# Patient Record
Sex: Female | Born: 1995 | Race: White | Hispanic: No | Marital: Single | Smoking: Never smoker
Health system: Southern US, Community
[De-identification: ages and names within clinical notes are randomized; demographics above are authoritative.]

## PROBLEM LIST (undated history)

## (undated) HISTORY — PX: WISDOM TOOTH EXTRACTION: SHX21

## (undated) HISTORY — PX: CHOLECYSTECTOMY: SHX55

## (undated) HISTORY — PX: EYE SURGERY: SHX253

## (undated) HISTORY — PX: ADENOIDECTOMY: SUR15

## (undated) HISTORY — PX: TONSILLECTOMY: SUR1361

---
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
## 2015-01-24 DIAGNOSIS — R112 Nausea with vomiting, unspecified: Secondary | ICD-10-CM | POA: Insufficient documentation

## 2015-03-03 IMAGING — NM NM HEPATO W/GB/PHARM/[PERSON_NAME]
2 series · 12 of 12 positions shown · non-contrast
Comparison: None

CLINICAL DATA: Right upper quadrant abdominal pain and nausea for
last year

NUCLEAR MEDICINE HEPATOBILIARY IMAGING WITH GALLBLADDER EF:
TECHNIQUE: Sequential images of the abdomen were obtained for 60
minutes following intravenous administration of
radiopharmaceutical.  Patient then ingested 8 ounces of
commercially available Ensure Plus and imaging was continued for 60
minutes.  A time-activity curve was generated from tracer within
the gallbladder following Ensure Plus ingestion, and the
gallbladder ejection fraction was calculated.
Radiopharmaceutical:  5 mCi Tc-LLm mebrofenin

[Series 0: hepato · 3.20mm/px · 6 of 46 frames shown (1 of 2)]
[frame 4/46]
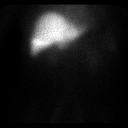
[frame 12/46]
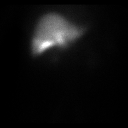
[frame 20/46]
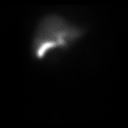
[frame 27/46]
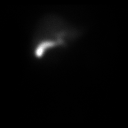
[frame 35/46]
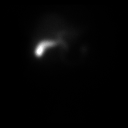
[frame 43/46]
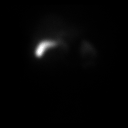

[Series 0: hepato · 3.20mm/px · 6 of 60 frames shown (2 of 2)]
[frame 6/60]
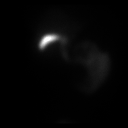
[frame 16/60]
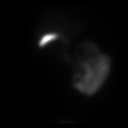
[frame 26/60]
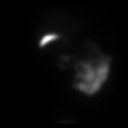
[frame 36/60]
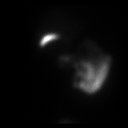
[frame 46/60]
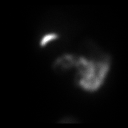
[frame 56/60]
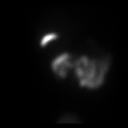

[12 of 12 positions shown; findings below may reference images not displayed]

FINDINGS: Prompt tracer extraction from bloodstream, indicating normal
hepatocellular function.
Prompt excretion of tracer into biliary tree.
Gallbladder visualized at 10 minutes.
Small bowel visualized at 34 minutes.
No hepatic retention of tracer.

Subjectively normal emptying of tracer from gallbladder following
fatty meal stimulation.
Calculated gallbladder ejection fraction is 64%, normal.
Patient experienced no symptoms following fatty meal ingestion.
IMPRESSION: Normal exam.

Normal values for gallbladder ejection fraction:
> 30% for exams utilizing sincalide (CCK)
> 33% for exams utilizing fatty meal stimulation with Ensure Plus

## 2015-08-25 ENCOUNTER — Encounter (HOSPITAL_BASED_OUTPATIENT_CLINIC_OR_DEPARTMENT_OTHER): Payer: Self-pay | Admitting: Emergency Medicine

## 2015-08-25 ENCOUNTER — Emergency Department (HOSPITAL_BASED_OUTPATIENT_CLINIC_OR_DEPARTMENT_OTHER)
Admission: EM | Admit: 2015-08-25 | Discharge: 2015-08-26 | Disposition: A | Discharge status: Home / Self Care | Payer: Managed Care, Other (non HMO) | Attending: Emergency Medicine | Admitting: Emergency Medicine

## 2015-08-25 DIAGNOSIS — R1031 Right lower quadrant pain: Secondary | ICD-10-CM | POA: Diagnosis present

## 2015-08-25 DIAGNOSIS — N390 Urinary tract infection, site not specified: Secondary | ICD-10-CM | POA: Insufficient documentation

## 2015-08-25 DIAGNOSIS — K5900 Constipation, unspecified: Secondary | ICD-10-CM | POA: Diagnosis not present

## 2015-08-25 DIAGNOSIS — IMO0001 Reserved for inherently not codable concepts without codable children: Secondary | ICD-10-CM

## 2015-08-25 LAB — URINALYSIS, ROUTINE W REFLEX MICROSCOPIC
Bilirubin Urine: NEGATIVE
GLUCOSE, UA: NEGATIVE mg/dL
HGB URINE DIPSTICK: NEGATIVE
Ketones, ur: NEGATIVE mg/dL
Nitrite: NEGATIVE
PH: 7 (ref 5.0–8.0)
PROTEIN: NEGATIVE mg/dL
Specific Gravity, Urine: 1.002 — ABNORMAL LOW (ref 1.005–1.030)

## 2015-08-25 LAB — PREGNANCY, URINE: PREG TEST UR: NEGATIVE

## 2015-08-25 LAB — URINE MICROSCOPIC-ADD ON: RBC / HPF: NONE SEEN RBC/hpf (ref 0–5)

## 2015-08-25 MED ORDER — NITROFURANTOIN MONOHYD MACRO 100 MG PO CAPS
100.0000 mg | ORAL_CAPSULE | Freq: Once | ORAL | Status: AC
  Administered 2015-08-26: 100 mg via ORAL
  Filled 2015-08-25: qty 1

## 2015-08-25 MED ORDER — GI COCKTAIL ~~LOC~~
30.0000 mL | Freq: Once | ORAL | Status: AC
  Administered 2015-08-26: 30 mL via ORAL
  Filled 2015-08-25: qty 30

## 2015-08-25 MED ORDER — DICYCLOMINE HCL 10 MG PO CAPS
10.0000 mg | ORAL_CAPSULE | Freq: Once | ORAL | Status: AC
  Administered 2015-08-26: 10 mg via ORAL
  Filled 2015-08-25: qty 1

## 2015-08-25 NOTE — ED Notes (Signed)
Pt c/o RLQ abd pain with urinary frequency since yesterday. Pt also c/o nausea, denies vomiting and diarrhea.

## 2015-08-25 NOTE — ED Provider Notes (Signed)
CSN: [id number]     Arrival date & time 08/25/15  2225 History   By signing my name below, I, Hollace Hayward, attest that this documentation has been prepared under the direction and in the presence of Nahal Wanless, MD.  Electronically Signed: Hollace Hayward, ED Scribe. 08/26/2015. 12:02 AM.     Chief Complaint  Patient presents with  . Abdominal Pain   Patient is a 20 y.o. female presenting with abdominal pain. The history is provided by the patient.  Abdominal Pain Pain location:  Suprapubic Pain quality: cramping   Pain radiates to:  Does not radiate Pain severity:  Moderate Onset quality:  Gradual Timing:  Constant Progression:  Unchanged Chronicity:  New Context: not alcohol use and not trauma   Relieved by:  Nothing Worsened by:  Nothing tried Ineffective treatments:  OTC medications Associated symptoms: no diarrhea, no fever, no shortness of breath, no vaginal bleeding, no vaginal discharge and no vomiting   Risk factors: no alcohol abuse and not pregnant    HPI Comments: Patricia Gould is a 20 y.o. female who presents to the Emergency Department complaining of gradual onset, gradaully worsening suprapubic abdominal pain that began last night. Pt has associated frequency. Pt reports that she ate a biscuit for breakfast and a salad for dinner. Her last known bowel movement was three days ago. Pt took three doses of Tylenol today PTA with no relief. Pt has no hx of similar sx. Pt denies dysuria and fever.   History reviewed. No pertinent past medical history. Past Surgical History  Procedure Laterality Date  . Eye surgery    . Tonsillectomy    . Adenoidectomy    . Wisdom tooth extraction    . Cholecystectomy     Family History  Problem Relation Age of Onset  . Cancer Maternal Grandmother     ovarian  . Diabetes Maternal Grandfather   . Cancer Paternal Grandfather     lung   Social History  Substance Use Topics  . Smoking status: Never Smoker   . Smokeless tobacco:  Never Used  . Alcohol Use: No   OB History    No data available     Review of Systems  Constitutional: Negative for fever.  Respiratory: Negative for shortness of breath.   Gastrointestinal: Positive for abdominal pain. Negative for vomiting and diarrhea.  Genitourinary: Positive for frequency. Negative for flank pain, vaginal bleeding and vaginal discharge.  All other systems reviewed and are negative.   Allergies  Review of patient's allergies indicates no known allergies.  Home Medications   Prior to Admission medications   Medication Sig Start Date End Date Taking? Authorizing Provider  ondansetron (ZOFRAN) 4 MG tablet Take 1 tablet (4 mg total) by mouth every 6 (six) hours. 10/22/12   Gaynelle Adu, MD   BP 127/79 mmHg  Pulse 99  Temp(Src) 98.8 F (37.1 C) (Oral)  Resp 16  Ht 5\' 6"  (1.676 m)  Wt 185 lb (83.915 kg)  BMI 29.87 kg/m2  SpO2 100%  LMP 07/26/2015 (Exact Date)   Physical Exam  Constitutional: She is oriented to person, place, and time. She appears well-developed and well-nourished.  HENT:  Head: Normocephalic and atraumatic.  Mouth/Throat: Oropharynx is clear and moist. No oropharyngeal exudate.  Moist mucous membranes.   Eyes: Conjunctivae and EOM are normal. Pupils are equal, round, and reactive to light.  Neck: Normal range of motion. Neck supple. No JVD present. No tracheal deviation present.  Cardiovascular: Normal rate, regular rhythm and  normal heart sounds.  Exam reveals no gallop and no friction rub.   No murmur heard. RRR.   Pulmonary/Chest: Effort normal and breath sounds normal. No stridor. No respiratory distress. She has no wheezes. She has no rales.  Lungs CTA bilaterally.   Abdominal: Soft. She exhibits no distension. Bowel sounds are increased. There is no tenderness. There is no rigidity, no rebound, no guarding, no tenderness at McBurney's point and negative Murphy's sign.  Gassy throughout, worse in area of pain. Hard stool noted. No  McBurney's point tenderness, no Murphy's sign.   Musculoskeletal: Normal range of motion.  Lymphadenopathy:    She has no cervical adenopathy.  Neurological: She is alert and oriented to person, place, and time. She has normal reflexes.  Skin: Skin is warm and dry.  Psychiatric: She has a normal mood and affect.  Nursing note and vitals reviewed.   ED Course  Procedures (including critical care time)  DIAGNOSTIC STUDIES: Oxygen Saturation is 100% on RA, normal by my interpretation.   COORDINATION OF CARE: 11:56 PM-Discussed next steps with pt including DG abdomen. Pt verbalized understanding and is agreeable with the plan.   Labs Review Labs Reviewed  URINALYSIS, ROUTINE W REFLEX MICROSCOPIC (NOT AT [facility]) - Abnormal; Notable for the following:    APPearance CLOUDY (*)    Specific Gravity, Urine 1.002 (*)    Leukocytes, UA SMALL (*)    All other components within normal limits  URINE MICROSCOPIC-ADD ON - Abnormal; Notable for the following:    Squamous Epithelial / LPF 6-30 (*)    Bacteria, UA RARE (*)    All other components within normal limits  PREGNANCY, URINE    Imaging Review No results found. I have personally reviewed and evaluated these images and lab results as part of my medical decision-making.   EKG Interpretation None      MDM   Final diagnoses:  None   Filed Vitals:   08/25/15 2234 08/26/15 0028  BP: 127/79 122/79  Pulse: 99 75  Temp: 98.8 F (37.1 C) 98.3 F (36.8 C)  Resp: 16 18    Results for orders placed or performed during the hospital encounter of 08/25/15  Pregnancy, urine  Result Value Ref Range   Preg Test, Ur NEGATIVE NEGATIVE  Urinalysis, Routine w reflex microscopic (not at [facility])  Result Value Ref Range   Color, Urine YELLOW YELLOW   APPearance CLOUDY (A) CLEAR   Specific Gravity, Urine 1.002 (L) 1.005 - 1.030   pH 7.0 5.0 - 8.0   Glucose, UA NEGATIVE NEGATIVE mg/dL   Hgb urine dipstick NEGATIVE NEGATIVE   Bilirubin  Urine NEGATIVE NEGATIVE   Ketones, ur NEGATIVE NEGATIVE mg/dL   Protein, ur NEGATIVE NEGATIVE mg/dL   Nitrite NEGATIVE NEGATIVE   Leukocytes, UA SMALL (A) NEGATIVE  Urine microscopic-add on  Result Value Ref Range   Squamous Epithelial / LPF 6-30 (A) NONE SEEN   WBC, UA 0-5 0 - 5 WBC/hpf   RBC / HPF NONE SEEN 0 - 5 RBC/hpf   Bacteria, UA RARE (A) NONE SEEN   No results found.  Medications  gi cocktail (Maalox,Lidocaine,Donnatal) (30 mLs Oral Given 08/26/15 0012)  dicyclomine (BENTYL) capsule 10 mg (10 mg Oral Given 08/26/15 0012)  nitrofurantoin (macrocrystal-monohydrate) (MACROBID) capsule 100 mg (100 mg Oral Given 08/26/15 0012)    Refused acute abdominal series.  Well appearing exam and vitals are benign and reassuring.  Feeling improved post meds.  Start miralax daily.  Strict return precautions given  I personally performed the services described in this documentation, which was scribed in my presence. The recorded information has been reviewed and is accurate.       Cy BlamerApril Acquanetta Cabanilla, MD 08/26/15 [phone]

## 2015-08-26 ENCOUNTER — Encounter (HOSPITAL_BASED_OUTPATIENT_CLINIC_OR_DEPARTMENT_OTHER): Payer: Self-pay | Admitting: Emergency Medicine

## 2015-08-26 MED ORDER — POLYETHYLENE GLYCOL 3350 17 GM/SCOOP PO POWD: 17.0000 g | Freq: Every day | ORAL | Status: DC

## 2015-08-26 MED ORDER — DICYCLOMINE HCL 20 MG PO TABS: 20.0000 mg | ORAL_TABLET | Freq: Two times a day (BID) | ORAL | Status: DC

## 2015-08-26 MED ORDER — NITROFURANTOIN MONOHYD MACRO 100 MG PO CAPS: 100.0000 mg | ORAL_CAPSULE | Freq: Two times a day (BID) | ORAL | Status: DC

## 2015-08-26 NOTE — ED Notes (Signed)
Pt refused abd xray after discussion of risk to benefit. Tyjah verbalized understanding that the xray would allow doctor to assess bowel status and course of treatment but she continues to refused further study. Dr Nicanor AlconPalumbo informed and no new orders obtained.

## 2015-09-07 DIAGNOSIS — R1031 Right lower quadrant pain: Secondary | ICD-10-CM | POA: Insufficient documentation

## 2015-09-07 DIAGNOSIS — K591 Functional diarrhea: Secondary | ICD-10-CM | POA: Insufficient documentation

## 2016-01-09 DIAGNOSIS — R5383 Other fatigue: Secondary | ICD-10-CM | POA: Insufficient documentation

## 2016-11-12 ENCOUNTER — Encounter: Payer: Self-pay | Admitting: Obstetrics and Gynecology

## 2016-11-27 ENCOUNTER — Encounter: Payer: Self-pay | Admitting: Obstetrics and Gynecology

## 2017-08-24 ENCOUNTER — Encounter (HOSPITAL_BASED_OUTPATIENT_CLINIC_OR_DEPARTMENT_OTHER): Payer: Self-pay | Admitting: Emergency Medicine

## 2017-08-24 ENCOUNTER — Emergency Department (HOSPITAL_BASED_OUTPATIENT_CLINIC_OR_DEPARTMENT_OTHER): Payer: Commercial Managed Care - PPO

## 2017-08-24 ENCOUNTER — Emergency Department (HOSPITAL_BASED_OUTPATIENT_CLINIC_OR_DEPARTMENT_OTHER)
Admission: EM | Admit: 2017-08-24 | Discharge: 2017-08-24 | Disposition: A | Discharge status: Home / Self Care | Payer: Commercial Managed Care - PPO | Attending: Emergency Medicine | Admitting: Emergency Medicine

## 2017-08-24 ENCOUNTER — Other Ambulatory Visit: Payer: Self-pay

## 2017-08-24 DIAGNOSIS — W19XXXA Unspecified fall, initial encounter: Secondary | ICD-10-CM

## 2017-08-24 DIAGNOSIS — M25531 Pain in right wrist: Secondary | ICD-10-CM | POA: Insufficient documentation

## 2017-08-24 DIAGNOSIS — M545 Low back pain, unspecified: Secondary | ICD-10-CM

## 2017-08-24 DIAGNOSIS — Z79899 Other long term (current) drug therapy: Secondary | ICD-10-CM | POA: Diagnosis not present

## 2017-08-24 MED ORDER — FENTANYL CITRATE (PF) 100 MCG/2ML IJ SOLN
50.0000 ug | INTRAMUSCULAR | Status: DC | PRN
  Administered 2017-08-24: 50 ug via INTRAVENOUS
  Filled 2017-08-24: qty 2

## 2017-08-24 MED ORDER — ONDANSETRON HCL 4 MG/2ML IJ SOLN
4.0000 mg | Freq: Once | INTRAMUSCULAR | Status: AC
  Administered 2017-08-24: 4 mg via INTRAVENOUS
  Filled 2017-08-24: qty 2

## 2017-08-24 NOTE — Discharge Instructions (Signed)
Your x-rays are reassuring.  No broken bones in your lower back or right wrist.  Please wear the wrist brace for support.  You can take 800 mg ibuprofen every 6 hours for pain.  Ice twice a day for 15 minutes at a time.  Elevate the wrist when you can.  Follow-up with your regular doctor in a week if your symptoms are not improving.

## 2017-08-24 NOTE — ED Provider Notes (Signed)
[facility] EMERGENCY DEPARTMENT Provider Note   CSN: [id number] Arrival date & time: 08/24/17  1256     History   Chief Complaint Chief Complaint  Patient presents with  . Fall    HPI Patricia Gould is a 22 y.o. female.  HPI   Patricia Gould is a 21yo female with no significant past medical history who presents to the emergency department for evaluation following a fall.  Patient reports that she accidentally lost her footing and fell down several stairs at home earlier this morning.  Her feet fell out in front of her and she fell backwards.  She denies hitting her head or loss of consciousness.  States that she has pain in her lower back and right wrist.  Pain in the wrist was initially 9/10 in severity and felt sharp in nature, but has improved since she was given medication in triage.  Reports the pain is worsened with wrist movement or palpation of the ulnar aspect of the wrist.  States that her lower back pain is worse with twisting and bending.  She tried icing the wrist some which did not help with pain relief.  She denies headache, visual disturbance, numbness, weakness, open wound, arthralgias elsewhere.  She is able to ambulate independently despite pain.  History reviewed. No pertinent past medical history.  There are no active problems to display for this patient.   Past Surgical History:  Procedure Laterality Date  . ADENOIDECTOMY    . CHOLECYSTECTOMY    . EYE SURGERY    . TONSILLECTOMY    . WISDOM TOOTH EXTRACTION       OB History   None      Home Medications    Prior to Admission medications   Medication Sig Start Date End Date Taking? Authorizing Provider  dicyclomine (BENTYL) 20 MG tablet Take 1 tablet (20 mg total) by mouth 2 (two) times daily. 08/26/15   Palumbo, April, MD  nitrofurantoin, macrocrystal-monohydrate, (MACROBID) 100 MG capsule Take 1 capsule (100 mg total) by mouth 2 (two) times daily. X 7 days 08/26/15   Palumbo, April, MD    ondansetron (ZOFRAN) 4 MG tablet Take 1 tablet (4 mg total) by mouth every 6 (six) hours. 10/22/12   Gaynelle Adu, MD  polyethylene glycol powder [facility]) powder Take 17 g by mouth daily. 08/26/15   Palumbo, April, MD    Family History Family History  Problem Relation Age of Onset  . Cancer Maternal Grandmother        ovarian  . Diabetes Maternal Grandfather   . Cancer Paternal Grandfather        lung    Social History Social History   Tobacco Use  . Smoking status: Never Smoker  . Smokeless tobacco: Never Used  Substance Use Topics  . Alcohol use: No  . Drug use: No     Allergies   Patient has no known allergies.   Review of Systems Review of Systems  Constitutional: Negative for chills and fever.  Eyes: Negative for visual disturbance.  Musculoskeletal: Positive for arthralgias (right wrist) and back pain (lower). Negative for gait problem.  Skin: Negative for wound.  Neurological: Negative for weakness, light-headedness, numbness and headaches.     Physical Exam Updated Vital Signs BP 120/85   Pulse 65   Temp 98.7 F (37.1 C) (Oral)   Resp 18   Ht 5\' 5"  (1.651 m)   Wt 93 kg (205 lb)   LMP 08/22/2017   SpO2 100%  BMI 34.11 kg/m   Physical Exam  Constitutional: She is oriented to person, place, and time. She appears well-developed and well-nourished. No distress.  HENT:  Head: Normocephalic and atraumatic.  Mouth/Throat: Oropharynx is clear and moist. No oropharyngeal exudate.  Eyes: Pupils are equal, round, and reactive to light. Conjunctivae are normal. Right eye exhibits no discharge. Left eye exhibits no discharge.  Neck: Normal range of motion. Neck supple.  No midline cervical spine tenderness.   Cardiovascular: Normal rate and regular rhythm.  Pulmonary/Chest: Effort normal. No respiratory distress.  Abdominal: Soft. There is no tenderness.  Musculoskeletal:  Right wrist with tenderness to palpation over ulnar aspect.  Mild swelling noted over  right wrist compared to left. No joint effusion noted. Full active wrist flexion/extension and ulnar/radial deviation. No erythema or warmth overlaying the joint. There is no anatomic snuff box tenderness. Normal sensation and motor function in the median, ulnar, and radial nerve distributions. 2+ radial pulse.  No midline T-spine tenderness. Patient tender to palpation over several spinous processes of the L-spine.No paraspinal muscle tenderness. Strength 5/5 in bilateral knee flexion/extension. DP pulses 2+ bilaterally. Sensation to light touch intact in bilateral distal LE.   Neurological: She is alert and oriented to person, place, and time. Coordination normal.  Skin: Skin is warm and dry. Capillary refill takes less than 2 seconds. She is not diaphoretic.  Psychiatric: She has a normal mood and affect. Her behavior is normal.  Nursing note and vitals reviewed.    ED Treatments / Results  Labs (all labs ordered are listed, but only abnormal results are displayed) Labs Reviewed  PREGNANCY, URINE    EKG None  Radiology Dg Lumbar Spine Complete  Result Date: 08/24/2017 CLINICAL DATA:  Larey SeatFell down stairs today landing on back, mid back pain EXAM: LUMBAR SPINE - COMPLETE 4+ VIEW COMPARISON:  None. FINDINGS: Five non-rib bearing lumbar vertebrae. Osseous mineralization normal. Vertebral body and disc space heights maintained. No acute fracture, subluxation, bone destruction or spondylolysis. SI joints preserved. IMPRESSION: Normal exam. Electronically Signed   By: Ulyses SouthwardMark  Boles M.D.   On: 08/24/2017 15:19   Dg Wrist Complete Right  Result Date: 08/24/2017 CLINICAL DATA:  Acute RIGHT wrist pain following fall today. Initial encounter. EXAM: RIGHT WRIST - COMPLETE 3+ VIEW COMPARISON:  None. FINDINGS: There is no evidence of fracture or dislocation. There is no evidence of arthropathy or other focal bone abnormality. Soft tissues are unremarkable. IMPRESSION: Negative. Electronically Signed   By:  Harmon PierJeffrey  Hu M.D.   On: 08/24/2017 14:06    Procedures Procedures (including critical care time)  Medications Ordered in ED Medications  fentaNYL (SUBLIMAZE) injection 50 mcg (50 mcg Intravenous Given 08/24/17 1330)  ondansetron (ZOFRAN) injection 4 mg (4 mg Intravenous Given 08/24/17 1330)     Initial Impression / Assessment and Plan / ED Course  I have reviewed the triage vital signs and the nursing notes.  Pertinent labs & imaging results that were available during my care of the patient were reviewed by me and considered in my medical decision making (see chart for details).     Xray right wrist and lumbar spine without acute abnormality. She is neurovascularly intact. No open wound. Discussed RICE protocol and NSAIDs for pain. She is discharged with wrist brace for comfort. Discussed follow up with her PCP if pain is not improving in a week. Patient agrees and voices understanding.   Final Clinical Impressions(s) / ED Diagnoses   Final diagnoses:  Fall, initial encounter  Right  wrist pain  Acute midline low back pain without sciatica    ED Discharge Orders    None       Lawrence Marseilles 08/24/17 2024    Tegeler, Canary Brim, MD 08/25/17 [phone]

## 2017-08-24 NOTE — ED Triage Notes (Signed)
Patient states that she fell down the stairs just PTA  - the patient has pain to her lower back , right wrist and Head

## 2022-08-20 ENCOUNTER — Encounter: Payer: Self-pay | Admitting: Family Medicine

## 2022-08-20 ENCOUNTER — Ambulatory Visit (INDEPENDENT_AMBULATORY_CARE_PROVIDER_SITE_OTHER): Payer: Medicaid Other | Admitting: Family Medicine

## 2022-08-20 VITALS — BP 128/80 | HR 85 | Temp 98.2°F | Ht 65.0 in | Wt 205.8 lb

## 2022-08-20 DIAGNOSIS — F410 Panic disorder [episodic paroxysmal anxiety] without agoraphobia: Secondary | ICD-10-CM

## 2022-08-20 DIAGNOSIS — E669 Obesity, unspecified: Secondary | ICD-10-CM

## 2022-08-20 DIAGNOSIS — F411 Generalized anxiety disorder: Secondary | ICD-10-CM | POA: Diagnosis not present

## 2022-08-20 DIAGNOSIS — Z1159 Encounter for screening for other viral diseases: Secondary | ICD-10-CM

## 2022-08-20 DIAGNOSIS — Z114 Encounter for screening for human immunodeficiency virus [HIV]: Secondary | ICD-10-CM

## 2022-08-20 LAB — CBC WITH DIFFERENTIAL/PLATELET
Absolute Monocytes: 416 cells/uL (ref 200–950)
Basophils Absolute: 40 cells/uL (ref 0–200)
Basophils Relative: 0.5 %
Eosinophils Relative: 0.8 %
MCH: 26.1 pg — ABNORMAL LOW (ref 27.0–33.0)
MCHC: 32.3 g/dL (ref 32.0–36.0)
MCV: 80.8 fL (ref 80.0–100.0)
MPV: 11.1 fL (ref 7.5–12.5)
RBC: 4.9 10*6/uL (ref 3.80–5.10)

## 2022-08-20 MED ORDER — ESCITALOPRAM OXALATE 10 MG PO TABS: 10.0000 mg | ORAL_TABLET | Freq: Every day | ORAL | 1 refills | Status: DC

## 2022-08-20 NOTE — Progress Notes (Signed)
New Patient Office Visit  Subjective    Patient ID: Patricia Gould, female    DOB: December 02, 1995  Age: 27 y.o. MRN: [id number]  CC:  Chief Complaint  Patient presents with   Establish Care    HPI Patricia Gould presents to establish care. Oriented to practice routines and expectations. PMH includes none. Concerns today includes anxiety. Has tried walking and deep cleaning for her anxiety.   ANXIETY/STRESS Duration:uncontrolled Anxious mood: yes  Excessive worrying: yes Irritability: yes  Sweating: no Nausea: no Palpitations:no Hyperventilation: no Panic attacks: yes Agoraphobia: no  Obscessions/compulsions: no Depressed mood: no    08/20/2022   10:31 AM  Depression screen PHQ 2/9  Decreased Interest 0  Down, Depressed, Hopeless 0  PHQ - 2 Score 0  Altered sleeping 1  Tired, decreased energy 1  Change in appetite 2  Feeling bad or failure about yourself  0  Trouble concentrating 0  Moving slowly or fidgety/restless 0  Suicidal thoughts 0  PHQ-9 Score 4  Difficult doing work/chores Somewhat difficult   Anhedonia: no Weight changes: no Insomnia: yes hard to fall asleep  Hypersomnia: no Fatigue/loss of energy: yes Feelings of worthlessness: yes Feelings of guilt: no Impaired concentration/indecisiveness: no Suicidal ideations: no  Crying spells: no Recent Stressors/Life Changes:  starting law school in the fall   Relationship problems: no   Family stress: no     Financial stress: no    Job stress: no    Recent death/loss: no  Cervical CA screening: approximate date age 3 and was normal Tobacco: non-smoker STI: declines Vaccines:  UTD   Outpatient Encounter Medications as of 08/20/2022  Medication Sig   escitalopram (LEXAPRO) 10 MG tablet Take 1 tablet (10 mg total) by mouth daily.   [DISCONTINUED] dicyclomine (BENTYL) 20 MG tablet Take 1 tablet (20 mg total) by mouth 2 (two) times daily. (Patient not taking: Reported on 08/20/2022)   [DISCONTINUED]  fluticasone (FLONASE) 50 MCG/ACT nasal spray 1 spray by Each Nare route two (2) times a day as needed for rhinitis. for up to 7 days (Patient not taking: Reported on 08/20/2022)   [DISCONTINUED] nitrofurantoin, macrocrystal-monohydrate, (MACROBID) 100 MG capsule Take 1 capsule (100 mg total) by mouth 2 (two) times daily. X 7 days (Patient not taking: Reported on 08/20/2022)   [DISCONTINUED] ondansetron (ZOFRAN) 4 MG tablet Take 1 tablet (4 mg total) by mouth every 6 (six) hours. (Patient not taking: Reported on 08/20/2022)   [DISCONTINUED] polyethylene glycol powder (MIRALAX) powder Take 17 g by mouth daily. (Patient not taking: Reported on 08/20/2022)   No facility-administered encounter medications on file as of 08/20/2022.    History reviewed. No pertinent past medical history.  Past Surgical History:  Procedure Laterality Date   ADENOIDECTOMY     CHOLECYSTECTOMY     EYE SURGERY     TONSILLECTOMY     WISDOM TOOTH EXTRACTION      Family History  Problem Relation Age of Onset   Cancer Maternal Grandmother        ovarian   Diabetes Maternal Grandfather    Cancer Paternal Grandfather        lung    Social History   Socioeconomic History   Marital status: Single    Spouse name: Not on file   Number of children: Not on file   Years of education: Not on file   Highest education level: Not on file  Occupational History   Not on file  Tobacco Use   Smoking status: Never  Smokeless tobacco: Never  Substance and Sexual Activity   Alcohol use: No   Drug use: No   Sexual activity: Not on file  Other Topics Concern   Not on file  Social History Narrative   Not on file   Social Determinants of Health   Financial Resource Strain: Not on file  Food Insecurity: Not on file  Transportation Needs: Not on file  Physical Activity: Not on file  Stress: Not on file  Social Connections: Not on file  Intimate Partner Violence: Not on file    Review of Systems  Constitutional:  Negative.   HENT: Negative.    Eyes: Negative.   Respiratory: Negative.    Cardiovascular: Negative.   Gastrointestinal: Negative.   Genitourinary: Negative.   Musculoskeletal: Negative.   Skin: Negative.   Neurological: Negative.   Endo/Heme/Allergies: Negative.   Psychiatric/Behavioral:  The patient is nervous/anxious.   All other systems reviewed and are negative.       Objective    BP 128/80   Pulse 85   Temp 98.2 F (36.8 C) (Oral)   Ht 5\' 5"  (1.651 m)   Wt 205 lb 12.8 oz (93.4 kg)   LMP 08/11/2022 (Approximate)   SpO2 98%   BMI 34.25 kg/m   Physical Exam Vitals and nursing note reviewed.  Constitutional:      Appearance: Normal appearance. She is normal weight.  HENT:     Head: Normocephalic and atraumatic.  Pulmonary:     Effort: Pulmonary effort is normal.  Skin:    General: Skin is warm and dry.  Neurological:     General: No focal deficit present.     Mental Status: She is alert and oriented to person, place, and time. Mental status is at baseline.  Psychiatric:        Mood and Affect: Mood normal.        Behavior: Behavior normal.        Thought Content: Thought content normal.        Judgment: Judgment normal.         Assessment & Plan:   Problem List Items Addressed This Visit     Generalized anxiety disorder with panic attacks - Primary    PHQ 4. Patient would like better control of her anxiety, irritability, and panic attacks. She has not tried any medication in the past. I encouraged her to start therapy to aid in managing her symptoms. Will start Lexapro 10mg  daily. Denies SI/HI, discussed black box warning of SI, instructed to notify office of adverse effects. Follow up in 1 month.      Relevant Medications   escitalopram (LEXAPRO) 10 MG tablet   Other Relevant Orders   TSH   Other Visit Diagnoses     Screening for HIV (human immunodeficiency virus)       Relevant Orders   HIV Antibody (routine testing w rflx)   Need for  hepatitis C screening test       Relevant Orders   Hepatitis C antibody   Obesity (BMI 30.0-34.9)       Relevant Orders   CBC with Differential/Platelet   COMPLETE METABOLIC PANEL WITH GFR   LDL Cholesterol, Direct   TSH       Return in about 1 week (around 08/27/2022) for annual physical with PAP.   Park Meo, FNP

## 2022-08-20 NOTE — Assessment & Plan Note (Signed)
PHQ 4. Patient would like better control of her anxiety, irritability, and panic attacks. She has not tried any medication in the past. I encouraged her to start therapy to aid in managing her symptoms. Will start Lexapro 10mg  daily. Denies SI/HI, discussed black box warning of SI, instructed to notify office of adverse effects. Follow up in 1 month.

## 2022-08-20 NOTE — Patient Instructions (Signed)
It was great to meet you today and I'm excited to have you join the [facility] practice. I hope you had a positive experience today! If you feel so inclined, please feel free to recommend our practice to friends and family. Malakhi Markwood, FNP-C  

## 2022-08-21 LAB — COMPLETE METABOLIC PANEL WITH GFR
AG Ratio: 1.9 (calc) (ref 1.0–2.5)
ALT: 15 U/L (ref 6–29)
AST: 14 U/L (ref 10–30)
Albumin: 4.7 g/dL (ref 3.6–5.1)
Alkaline phosphatase (APISO): 46 U/L (ref 31–125)
BUN: 9 mg/dL (ref 7–25)
CO2: 28 mmol/L (ref 20–32)
Calcium: 10 mg/dL (ref 8.6–10.2)
Chloride: 103 mmol/L (ref 98–110)
Creat: 0.75 mg/dL (ref 0.50–0.96)
Globulin: 2.5 g/dL (calc) (ref 1.9–3.7)
Glucose, Bld: 92 mg/dL (ref 65–99)
Potassium: 4.3 mmol/L (ref 3.5–5.3)
Sodium: 141 mmol/L (ref 135–146)
Total Bilirubin: 0.4 mg/dL (ref 0.2–1.2)
Total Protein: 7.2 g/dL (ref 6.1–8.1)
eGFR: 111 mL/min/{1.73_m2} (ref >=60)

## 2022-08-21 LAB — CBC WITH DIFFERENTIAL/PLATELET
Eosinophils Absolute: 64 cells/uL (ref 15–500)
HCT: 39.6 % (ref 35.0–45.0)
Hemoglobin: 12.8 g/dL (ref 11.7–15.5)
Lymphs Abs: 1368 cells/uL (ref 850–3900)
Monocytes Relative: 5.2 %
Neutro Abs: 6112 cells/uL (ref 1500–7800)
Neutrophils Relative %: 76.4 %
Platelets: 341 10*3/uL (ref 140–400)
RDW: 14 % (ref 11.0–15.0)
Total Lymphocyte: 17.1 %
WBC: 8 10*3/uL (ref 3.8–10.8)

## 2022-08-21 LAB — HIV ANTIBODY (ROUTINE TESTING W REFLEX): HIV 1&2 Ab, 4th Generation: NONREACTIVE

## 2022-08-21 LAB — TSH: TSH: 0.98 mIU/L

## 2022-08-21 LAB — HEPATITIS C ANTIBODY: Hepatitis C Ab: NONREACTIVE

## 2022-08-21 LAB — LDL CHOLESTEROL, DIRECT: Direct LDL: 75 mg/dL (ref <100)

## 2022-08-26 ENCOUNTER — Encounter: Payer: Commercial Managed Care - PPO | Admitting: Family Medicine

## 2022-09-16 ENCOUNTER — Encounter: Payer: Self-pay | Admitting: Family Medicine

## 2022-09-17 ENCOUNTER — Other Ambulatory Visit: Payer: Self-pay

## 2022-09-17 DIAGNOSIS — F410 Panic disorder [episodic paroxysmal anxiety] without agoraphobia: Secondary | ICD-10-CM

## 2022-09-17 MED ORDER — ESCITALOPRAM OXALATE 10 MG PO TABS: 10.0000 mg | ORAL_TABLET | Freq: Every day | ORAL | 3 refills | Status: DC

## 2022-09-23 ENCOUNTER — Encounter: Payer: Commercial Managed Care - PPO | Admitting: Family Medicine

## 2022-10-01 ENCOUNTER — Encounter: Payer: Commercial Managed Care - PPO | Admitting: Family Medicine

## 2022-10-08 ENCOUNTER — Encounter: Payer: Commercial Managed Care - PPO | Admitting: Family Medicine

## 2022-10-14 ENCOUNTER — Ambulatory Visit: Payer: Commercial Managed Care - PPO | Admitting: Family Medicine

## 2022-10-14 ENCOUNTER — Encounter: Payer: Self-pay | Admitting: Family Medicine

## 2022-10-14 ENCOUNTER — Other Ambulatory Visit: Payer: Self-pay | Admitting: Family Medicine

## 2022-10-14 VITALS — BP 124/86 | HR 98 | Temp 98.3°F | Ht 65.0 in | Wt 205.0 lb

## 2022-10-14 DIAGNOSIS — Z0001 Encounter for general adult medical examination with abnormal findings: Secondary | ICD-10-CM | POA: Diagnosis not present

## 2022-10-14 DIAGNOSIS — F410 Panic disorder [episodic paroxysmal anxiety] without agoraphobia: Secondary | ICD-10-CM | POA: Diagnosis not present

## 2022-10-14 DIAGNOSIS — Z113 Encounter for screening for infections with a predominantly sexual mode of transmission: Secondary | ICD-10-CM

## 2022-10-14 DIAGNOSIS — Z124 Encounter for screening for malignant neoplasm of cervix: Secondary | ICD-10-CM

## 2022-10-14 DIAGNOSIS — F411 Generalized anxiety disorder: Secondary | ICD-10-CM

## 2022-10-14 DIAGNOSIS — Z0000 Encounter for general adult medical examination without abnormal findings: Secondary | ICD-10-CM | POA: Insufficient documentation

## 2022-10-14 MED ORDER — ESCITALOPRAM OXALATE 20 MG PO TABS: 20.0000 mg | ORAL_TABLET | Freq: Every day | ORAL | 1 refills | Status: DC

## 2022-10-14 NOTE — Assessment & Plan Note (Signed)

## 2022-10-14 NOTE — Assessment & Plan Note (Signed)
GAD 1. Patient would like better control of her anxiety, irritability, and panic attacks. She has been taking Lexapro 10mg  daily with some improvement but would like to increase her dose but to breakthrough symptoms. Denies SI/HI, discussed black box warning of SI, instructed to notify office of adverse effects.

## 2022-10-14 NOTE — Progress Notes (Signed)
Complete physical exam  Patient: Patricia Gould   DOB: [birth year]   27 y.o. Female  MRN: [id number]  Subjective:    Chief Complaint  Patient presents with   Annual Exam    Patricia Gould is a 27 y.o. female who presents today for a complete physical exam. She reports consuming a general and low calorie  diet. Home exercise routine includes walking 0.5 hrs per day. She generally feels well. She reports sleeping well. She does not have additional problems to discuss today.    Most recent fall risk assessment:    08/20/2022   10:32 AM  Fall Risk   Falls in the past year? 0  Number falls in past yr: 0  Injury with Fall? 0  Risk for fall due to : No Fall Risks  Follow up Falls evaluation completed;Education provided     Most recent depression screenings:    10/14/2022    8:08 AM 08/20/2022   10:31 AM  PHQ 2/9 Scores  PHQ - 2 Score 0 0  PHQ- 9 Score 2 4    Dental: No current dental problems  No past medical history on file. Past Surgical History:  Procedure Laterality Date   ADENOIDECTOMY     CHOLECYSTECTOMY     EYE SURGERY     TONSILLECTOMY     WISDOM TOOTH EXTRACTION     Social History   Tobacco Use   Smoking status: Never   Smokeless tobacco: Never  Substance Use Topics   Alcohol use: No   Drug use: No   Family History  Problem Relation Age of Onset   Cancer Maternal Grandmother        ovarian   Diabetes Maternal Grandfather    Cancer Paternal Grandfather        lung   No Known Allergies    Patient Care Team: Park Meo, FNP as PCP - General (Family Medicine)   Outpatient Medications Prior to Visit  Medication Sig   escitalopram (LEXAPRO) 10 MG tablet TAKE 1 TABLET BY MOUTH EVERY DAY   No facility-administered medications prior to visit.    Review of Systems  Constitutional: Negative.   HENT: Negative.    Eyes: Negative.   Respiratory: Negative.    Cardiovascular: Negative.   Gastrointestinal: Negative.   Genitourinary:  Negative.   Musculoskeletal: Negative.   Skin: Negative.   Neurological: Negative.   Endo/Heme/Allergies: Negative.   Psychiatric/Behavioral: Negative.    All other systems reviewed and are negative.         Objective:     BP 124/86   Pulse 98   Temp 98.3 F (36.8 C) (Oral)   Ht 5\' 5"  (1.651 m)   Wt 205 lb (93 kg)   LMP 09/30/2022 (Approximate)   SpO2 100%   BMI 34.11 kg/m  BP Readings from Last 3 Encounters:  10/14/22 124/86  08/20/22 128/80  08/24/17 120/84   Wt Readings from Last 3 Encounters:  10/14/22 205 lb (93 kg)  08/20/22 205 lb 12.8 oz (93.4 kg)  08/24/17 205 lb (93 kg)      Physical Exam Vitals and nursing note reviewed. Exam conducted with a chaperone present.  Constitutional:      Appearance: Normal appearance. She is normal weight.  HENT:     Head: Normocephalic and atraumatic.     Right Ear: Tympanic membrane, ear canal and external ear normal.     Left Ear: Tympanic membrane, ear canal and external ear normal.  Nose: Nose normal.     Mouth/Throat:     Mouth: Mucous membranes are moist.     Pharynx: Oropharynx is clear.  Eyes:     Extraocular Movements: Extraocular movements intact.     Conjunctiva/sclera: Conjunctivae normal.     Pupils: Pupils are equal, round, and reactive to light.  Cardiovascular:     Rate and Rhythm: Normal rate and regular rhythm.     Pulses: Normal pulses.     Heart sounds: Normal heart sounds.  Pulmonary:     Effort: Pulmonary effort is normal.     Breath sounds: Normal breath sounds.  Abdominal:     General: Bowel sounds are normal.     Palpations: Abdomen is soft.  Genitourinary:    General: Normal vulva.     Vagina: Normal.     Cervix: Normal.     Uterus: Normal.      Adnexa: Right adnexa normal and left adnexa normal.     Rectum: Normal.  Musculoskeletal:        General: Normal range of motion.     Cervical back: Normal range of motion and neck supple.  Skin:    General: Skin is warm and dry.      Capillary Refill: Capillary refill takes less than 2 seconds.  Neurological:     General: No focal deficit present.     Mental Status: She is alert and oriented to person, place, and time. Mental status is at baseline.  Psychiatric:        Mood and Affect: Mood normal.        Behavior: Behavior normal.        Thought Content: Thought content normal.        Judgment: Judgment normal.      No results found for any visits on 10/14/22. Last CBC Lab Results  Component Value Date   WBC 8.0 08/20/2022   HGB 12.8 08/20/2022   HCT 39.6 08/20/2022   MCV 80.8 08/20/2022   MCH 26.1 (L) 08/20/2022   RDW 14.0 08/20/2022   PLT 341 08/20/2022   Last metabolic panel Lab Results  Component Value Date   GLUCOSE 92 08/20/2022   NA 141 08/20/2022   K 4.3 08/20/2022   CL 103 08/20/2022   CO2 28 08/20/2022   BUN 9 08/20/2022   CREATININE 0.75 08/20/2022   EGFR 111 08/20/2022   CALCIUM 10.0 08/20/2022   PROT 7.2 08/20/2022   ALBUMIN 4.1 09/27/2012   BILITOT 0.4 08/20/2022   ALKPHOS 65 09/27/2012   AST 14 08/20/2022   ALT 15 08/20/2022   Last lipids Lab Results  Component Value Date   LDLDIRECT 75 08/20/2022   Last hemoglobin A1c No results found for: "HGBA1C" Last thyroid functions Lab Results  Component Value Date   TSH 0.98 08/20/2022   Last vitamin D No results found for: "25OHVITD2", "25OHVITD3", "VD25OH" Last vitamin B12 and Folate No results found for: "VITAMINB12", "FOLATE"      Assessment & Plan:    Routine Health Maintenance and Physical Exam  Immunization History  Administered Date(s) Administered   DTaP 11/08/1993, 09/06/1994, 01/30/1998, 08/31/1998   HIB (PRP-T) 11/09/1993, 09/10/1994, 09/16/1997, 01/30/1998   Hepatitis B, ADULT 11/04/2013, 01/06/2014   Hepatitis B, PED/ADOLESCENT [birth year]10[birth year]93, 10/25/1993, 09/06/1994   IPV 11/08/1993, 09/10/1994, 09/16/1997, 01/30/1998   Influenza,inj,quad, With Preservative 01/06/2014   Influenza-Unspecified 01/06/2014    MMR 09/17/1995, 01/30/1998, 11/04/2013   Meningococcal Conjugate 09/08/2018   Tdap 11/02/2013   Varicella 01/30/1998    Health  Maintenance  Topic Date Due   PAP-Cervical Cytology Screening  Never done   PAP SMEAR-Modifier  Never done   COVID-19 Vaccine (1 - 2023-24 season) Never done   INFLUENZA VACCINE  10/23/2022   DTaP/Tdap/Td (6 - Td or Tdap) 11/03/2023   Hepatitis C Screening  Completed   HIV Screening  Completed   HPV VACCINES  Aged Out    Discussed health benefits of physical activity, and encouraged her to engage in regular exercise appropriate for her age and condition.  Problem List Items Addressed This Visit     Generalized anxiety disorder with panic attacks    GAD 1. Patient would like better control of her anxiety, irritability, and panic attacks. She has been taking Lexapro 10mg  daily with some improvement but would like to increase her dose but to breakthrough symptoms. Denies SI/HI, discussed black box warning of SI, instructed to notify office of adverse effects.      Physical exam, annual - Primary    Today your medical history was reviewed and routine physical exam with labs was performed. Recommend 150 minutes of moderate intensity exercise weekly and consuming a well-balanced diet. Advised to stop smoking if a smoker, avoid smoking if a non-smoker, limit alcohol consumption to 1 drink per day for women and 2 drinks per day for men, and avoid illicit drug use. Counseled on safe sex practices and offered STI testing today. Counseled on the importance of sunscreen use. Counseled in mental health awareness and when to seek medical care. Vaccine maintenance discussed. Appropriate health maintenance items reviewed. Return to office in 1 year for annual physical exam.       Return in about 1 year (around 10/14/2023) for annual physical with labs 1 week prior.     Park Meo, FNP

## 2022-10-15 LAB — SURESWAB® ADVANCED VAGINITIS,TMA
CANDIDA SPECIES: DETECTED — AB
Candida glabrata: NOT DETECTED
SURESWAB(R) ADV BACTERIAL VAGINOSIS(BV),TMA: POSITIVE — AB
TRICHOMONAS VAGINALIS (TV),TMA: NOT DETECTED

## 2022-10-16 ENCOUNTER — Other Ambulatory Visit: Payer: Self-pay | Admitting: Family Medicine

## 2022-10-16 DIAGNOSIS — N760 Acute vaginitis: Secondary | ICD-10-CM

## 2022-10-16 DIAGNOSIS — B3731 Acute candidiasis of vulva and vagina: Secondary | ICD-10-CM

## 2022-10-16 MED ORDER — FLUCONAZOLE 150 MG PO TABS: 150.0000 mg | ORAL_TABLET | Freq: Once | ORAL | 0 refills | Status: AC

## 2022-10-16 MED ORDER — METRONIDAZOLE 500 MG PO TABS: 500.0000 mg | ORAL_TABLET | Freq: Two times a day (BID) | ORAL | 0 refills | Status: AC

## 2022-10-20 ENCOUNTER — Encounter: Payer: Self-pay | Admitting: Family Medicine

## 2023-04-05 ENCOUNTER — Telehealth: Payer: Medicaid Other | Admitting: Family

## 2023-04-05 DIAGNOSIS — F411 Generalized anxiety disorder: Secondary | ICD-10-CM | POA: Diagnosis not present

## 2023-04-05 MED ORDER — BUSPIRONE HCL 5 MG PO TABS: 5.0000 mg | ORAL_TABLET | Freq: Three times a day (TID) | ORAL | 1 refills | Status: DC | PRN

## 2023-04-05 NOTE — Progress Notes (Signed)
 Virtual Visit Consent   Patricia Gould, you are scheduled for a virtual visit with a [facility] provider today. Just as with appointments in the office, your consent must be obtained to participate. Your consent will be active for this visit and any virtual visit you may have with one of our providers in the next 365 days. If you have a MyChart account, a copy of this consent can be sent to you electronically.  As this is a virtual visit, video technology does not allow for your provider to perform a traditional examination. This may limit your provider's ability to fully assess your condition. If your provider identifies any concerns that need to be evaluated in person or the need to arrange testing (such as labs, EKG, etc.), we will make arrangements to do so. Although advances in technology are sophisticated, we cannot ensure that it will always work on either your end or our end. If the connection with a video visit is poor, the visit may have to be switched to a telephone visit. With either a video or telephone visit, we are not always able to ensure that we have a secure connection.  By engaging in this virtual visit, you consent to the provision of healthcare and authorize for your insurance to be billed (if applicable) for the services provided during this visit. Depending on your insurance coverage, you may receive a charge related to this service.  I need to obtain your verbal consent now. Are you willing to proceed with your visit today? Patricia Gould has provided verbal consent on 04/05/2023 for a virtual visit (video or telephone). Patricia Learn, FNP  Date: 04/05/2023 5:35 PM  Virtual Visit via Video Note   I, Patricia Gould, connected with  Patricia Gould  ([id number], 17-Oct-1995) on 04/05/23 at  6:15 PM EST by a video-enabled telemedicine application and verified that I am speaking with the correct person using two identifiers.  Location: Patient: Virtual Visit Location Patient:  Home Provider: Virtual Visit Location Provider: Home Office   I discussed the limitations of evaluation and management by telemedicine and the availability of in person appointments. The patient expressed understanding and agreed to proceed.    History of Present Illness: Patricia Gould is a 28 y.o. who identifies as a female who was assigned female at birth, and is being seen today for GAD. She reports her anxiety is worsening. She has been on Lexapro  20 mg since July 2024.   HPI: Anxiety Presents for follow-up visit. Symptoms include depressed mood, excessive worry, irritability, nervous/anxious behavior and restlessness. Patient reports no suicidal ideas.    Depression        This is a chronic problem.  The current episode started more than 1 year ago.   Associated symptoms include restlessness and sad.  Associated symptoms include no helplessness, no hopelessness and no suicidal ideas.  Past treatments include SSRIs - Selective serotonin reuptake inhibitors.  Past medical history includes anxiety.     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  Resting comfortably  at home.  Head is normocephalic, atraumatic.  No labored breathing.  Speech is clear and coherent with logical content.  Patient is alert and oriented at baseline.     Assessment and Plan: 1. GAD (generalized anxiety disorder) (Primary) - busPIRone  (BUSPAR ) 5 MG tablet; Take 1 tablet (5 mg total) by mouth 3 (three) times daily as needed.  Dispense: 90 tablet; Refill: 1  Will continue Lexapro  20 mg daily Will add Buspar  5 mg TID prn  Stress management  Pt will make follow up with PCP to discuss increased GAD Denies any SI at this time. Go to ED with any SI or HI  Follow Up Instructions: I discussed the assessment and treatment plan with the patient. The patient was provided an opportunity to ask questions and all were answered. The patient agreed with the plan and demonstrated an understanding of the instructions.  A copy of instructions were sent to the patient via MyChart unless otherwise noted below.     The patient was advised to call back or seek an in-person evaluation if the symptoms worsen or if the condition fails to improve as anticipated.    Patricia Learn, FNP

## 2023-04-11 ENCOUNTER — Other Ambulatory Visit: Payer: Self-pay | Admitting: Family Medicine

## 2023-04-11 DIAGNOSIS — F410 Panic disorder [episodic paroxysmal anxiety] without agoraphobia: Secondary | ICD-10-CM

## 2023-10-01 ENCOUNTER — Other Ambulatory Visit: Payer: Self-pay | Admitting: Family Medicine

## 2023-10-01 DIAGNOSIS — F410 Panic disorder [episodic paroxysmal anxiety] without agoraphobia: Secondary | ICD-10-CM

## 2023-10-05 ENCOUNTER — Telehealth: Payer: Self-pay

## 2023-10-05 ENCOUNTER — Other Ambulatory Visit: Payer: Self-pay | Admitting: Family Medicine

## 2023-10-05 DIAGNOSIS — F410 Panic disorder [episodic paroxysmal anxiety] without agoraphobia: Secondary | ICD-10-CM

## 2023-10-05 MED ORDER — ESCITALOPRAM OXALATE 20 MG PO TABS: 20.0000 mg | ORAL_TABLET | Freq: Every day | ORAL | 1 refills | Status: DC

## 2023-10-05 NOTE — Telephone Encounter (Signed)
 Copied from CRM [id number]. Topic: Clinical - Prescription Issue >> Oct 05, 2023 11:46 AM Powell HERO wrote: Reason for CRM: Patient has been out of escitalopram  (LEXAPRO ) 20 MG tablet  for 4 days, no updates from the pharmacy on her refill request.

## 2023-10-06 ENCOUNTER — Telehealth: Admitting: Family Medicine

## 2023-10-06 ENCOUNTER — Other Ambulatory Visit: Payer: Self-pay | Admitting: Family Medicine

## 2023-10-06 ENCOUNTER — Encounter: Payer: Self-pay | Admitting: Family Medicine

## 2023-10-06 DIAGNOSIS — F410 Panic disorder [episodic paroxysmal anxiety] without agoraphobia: Secondary | ICD-10-CM

## 2023-10-06 DIAGNOSIS — F411 Generalized anxiety disorder: Secondary | ICD-10-CM

## 2023-10-06 MED ORDER — FLUOXETINE HCL 40 MG PO CAPS: 40.0000 mg | ORAL_CAPSULE | Freq: Every day | ORAL | 1 refills | Status: DC

## 2023-10-06 NOTE — Progress Notes (Signed)
 Virtual Visit via Video note  I connected with Patricia Gould on 10/06/23 at 1545 by video and verified that I am speaking with the correct person using two identifiers. Patricia Gould is currently located at home and no one is currently with her during visit. The provider, Jeoffrey GORMAN Barrio, FNP is located in their office at time of visit.  I discussed the limitations, risks, security and privacy concerns of performing an evaluation and management service by video and the availability of in person appointments. I also discussed with the patient that there may be a patient responsible charge related to this service. The patient expressed understanding and agreed to proceed.  Subjective: PCP: Barrio Jeoffrey GORMAN, FNP  No chief complaint on file.   HPI [name] is here today for medication refill request. She is taking Lexapro  20mg  daily. Has stopped taking Buspar  in April due to feeling tired. She is in Social worker school and endorses ongoing but manageable anxiety. Could use dose increase. She is on break from school and worries more. Is walking 3 miles daily. She is socially interactive, does not see a therapist. Has not tried other medications.      10/06/2023    3:47 PM 10/14/2022    8:08 AM 08/20/2022   10:31 AM  GAD 7 : Generalized Anxiety Score  Nervous, Anxious, on Edge 3 1 3   Control/stop worrying 3  3  Worry too much - different things 3 1 3   Trouble relaxing 3 0 2  Restless 0 0 1  Easily annoyed or irritable 2 1 2   Afraid - awful might happen 0 1 2  Total GAD 7 Score 14  16  Anxiety Difficulty Not difficult at all Somewhat difficult Very difficult       10/06/2023    3:50 PM 10/14/2022    8:08 AM 08/20/2022   10:31 AM  Depression screen PHQ 2/9  Decreased Interest 0 0 0  Down, Depressed, Hopeless 0 0 0  PHQ - 2 Score 0 0 0  Altered sleeping  2 1  Tired, decreased energy  0 1  Change in appetite  0 2  Feeling bad or failure about yourself   0 0  Trouble concentrating  0 0   Moving slowly or fidgety/restless  0 0  Suicidal thoughts  0 0  PHQ-9 Score  2 4  Difficult doing work/chores  Not difficult at all Somewhat difficult     ROS: Per HPI  Current Outpatient Medications:    FLUoxetine  (PROZAC ) 40 MG capsule, Take 1 capsule (40 mg total) by mouth daily., Disp: 30 capsule, Rfl: 1  Observations/Objective: Physical Exam Constitutional:      General: She is not in acute distress.    Appearance: Normal appearance. She is not toxic-appearing.  Eyes:     Conjunctiva/sclera: Conjunctivae normal.  Pulmonary:     Effort: Pulmonary effort is normal. No respiratory distress.  Skin:    Coloration: Skin is not pale.  Neurological:     General: No focal deficit present.     Mental Status: She is alert and oriented to person, place, and time.  Psychiatric:        Mood and Affect: Mood normal.        Behavior: Behavior normal.        Thought Content: Thought content normal.        Judgment: Judgment normal.    Assessment and Plan: GAD (generalized anxiety disorder) Assessment & Plan: Anxiety uncontrolled on Lexapro  20mg   daily. Has tried augmenting with Buspar  however this made her feel tired. Would like to try an alternative while she is out of school on break. Stop Lexapro  and start Prozac  40mg  daily. Notify office if experiencing side effects. Follow up in 4 weeks for anxiety and CPE   Other orders -     FLUoxetine  HCl; Take 1 capsule (40 mg total) by mouth daily.  Dispense: 30 capsule; Refill: 1    Follow Up Instructions: No follow-ups on file.   I discussed the assessment and treatment plan with the patient. The patient was provided an opportunity to ask questions and all were answered. The patient agreed with the plan and demonstrated an understanding of the instructions.   The patient was advised to call back or seek an in-person evaluation if the symptoms worsen or if the condition fails to improve as anticipated.  The above assessment and  management plan was discussed with the patient. The patient verbalized understanding of and has agreed to the management plan. Patient is aware to call the clinic if symptoms persist or worsen. Patient is aware when to return to the clinic for a follow-up visit. Patient educated on when it is appropriate to go to the emergency department.   Time call ended: 1557  I provided 11 minutes of face-to-face time during this encounter.   Jeoffrey Barrio, MSN, APRN, FNP-C [facility]

## 2023-10-06 NOTE — Assessment & Plan Note (Signed)
 Anxiety uncontrolled on Lexapro  20mg  daily. Has tried augmenting with Buspar  however this made her feel tired. Would like to try an alternative while she is out of school on break. Stop Lexapro  and start Prozac  40mg  daily. Notify office if experiencing side effects. Follow up in 4 weeks for anxiety and CPE

## 2023-10-06 NOTE — Telephone Encounter (Unsigned)
 Copied from CRM [id number]. Topic: Clinical - Medication Refill >> Oct 06, 2023 11:10 AM Jalayah J wrote: Medication: escitalopram  (LEXAPRO ) 20 MG tablet  Has the patient contacted their pharmacy? Yes (Agent: If no, request that the patient contact the pharmacy for the refill. If patient does not wish to contact the pharmacy document the reason why and proceed with request.) (Agent: If yes, when and what did the pharmacy advise?)  This is the patient's preferred pharmacy:  [id number] [place], [place] - [address] [place] [address] Phone: [phone] Fax: [phone]  Is this the correct pharmacy for this prescription? Yes If no, delete pharmacy and type the correct one.   Has the prescription been filled recently? Yes  Is the patient out of the medication? Yes  Has the patient been seen for an appointment in the last year OR does the patient have an upcoming appointment? No  Can we respond through MyChart? Yes  Agent: Please be advised that Rx refills may take up to 3 business days. We ask that you follow-up with your pharmacy.

## 2023-10-14 ENCOUNTER — Encounter: Payer: Medicaid Other | Admitting: Family Medicine

## 2023-10-28 ENCOUNTER — Other Ambulatory Visit: Payer: Self-pay | Admitting: Family Medicine

## 2023-12-23 NOTE — Addendum Note (Signed)
 Addended by: KAYLA JEOFFREY RAMAN on: 12/23/2023 03:49 PM   Modules accepted: Level of Service

## 2024-01-14 ENCOUNTER — Encounter: Admitting: Family Medicine

## 2024-01-17 ENCOUNTER — Encounter: Payer: Self-pay | Admitting: Family Medicine

## 2024-05-04 ENCOUNTER — Encounter: Admitting: Family Medicine
